# Patient Record
Sex: Male | Born: 2000 | Race: White | Hispanic: No | Marital: Single | Smoking: Never smoker
Health system: Southern US, Community
[De-identification: ages and names within clinical notes are randomized; demographics above are authoritative.]

---
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
## 2023-12-27 ENCOUNTER — Other Ambulatory Visit: Payer: Self-pay

## 2023-12-27 ENCOUNTER — Emergency Department (HOSPITAL_COMMUNITY)
Admission: EM | Admit: 2023-12-27 | Discharge: 2023-12-27 | Disposition: A | Discharge status: Home / Self Care | Payer: Worker's Compensation | Attending: Emergency Medicine | Admitting: Emergency Medicine

## 2023-12-27 ENCOUNTER — Encounter (HOSPITAL_COMMUNITY): Payer: Self-pay | Admitting: Emergency Medicine

## 2023-12-27 DIAGNOSIS — S60941A Unspecified superficial injury of left index finger, initial encounter: Secondary | ICD-10-CM | POA: Diagnosis present

## 2023-12-27 DIAGNOSIS — X58XXXA Exposure to other specified factors, initial encounter: Secondary | ICD-10-CM | POA: Insufficient documentation

## 2023-12-27 DIAGNOSIS — S61301A Unspecified open wound of left index finger with damage to nail, initial encounter: Secondary | ICD-10-CM | POA: Insufficient documentation

## 2023-12-27 DIAGNOSIS — T148XXA Other injury of unspecified body region, initial encounter: Secondary | ICD-10-CM

## 2023-12-27 MED ORDER — SILVER NITRATE-POT NITRATE 75-25 % EX MISC
1.0000 | Freq: Once | CUTANEOUS | Status: AC
  Administered 2023-12-27: 1 via TOPICAL
  Filled 2023-12-27: qty 10

## 2023-12-27 MED ORDER — LIDOCAINE-EPINEPHRINE-TETRACAINE (LET) TOPICAL GEL
3.0000 mL | Freq: Once | TOPICAL | Status: AC
  Administered 2023-12-27: 3 mL via TOPICAL
  Filled 2023-12-27: qty 3

## 2023-12-27 NOTE — ED Provider Triage Note (Signed)
 Emergency Medicine Provider Triage Evaluation Note  Devin Porter , a 23 y.o. male  was evaluated in triage.  Pt complains of finger avulsion.  Patient was seen earlier today for evaluation of tissue avulsion which occurred at work earlier.  He is up-to-date on tetanus vaccination.  They were able to get his bleeding to stop earlier at the urgent care however when he got home it bled through his bandage and he return for further evaluation.  He is right-hand dominant.  This is his left index finger.  Review of Systems  Positive: Tissue avulsion and active bleeding Negative: Pulsatile  Physical Exam  BP (!) 146/82 (BP Location: Right Arm)   Pulse 76   Temp 98.4 F (36.9 C) (Oral)   Resp 17   SpO2 99%  Gen:   Awake, no distress   Resp:  Normal effort  MSK:   Moves extremities without difficulty  Other:    Medical Decision Making  Medically screening exam initiated at 5:10 PM.  Appropriate orders placed.  Arthea Levander Minder was informed that the remainder of the evaluation will be completed by another provider, this initial triage assessment does not replace that evaluation, and the importance of remaining in the ED until their evaluation is complete.  Let applied 1713   Arloa Chroman, PA-C 12/27/23 1713

## 2023-12-27 NOTE — Discharge Instructions (Signed)
 WOUND CARE   Keep area clean and dry for 48 hours. Do not remove bandage, if applied.  After 48hours, remove bandage and wash wound gently with mild soap and warm water. Reapply a new bandage after cleaning wound, if directed.  Continue daily cleansing with soap and water until stitches/staples are removed.  Seek medical careif you experience any of the following signs of infection: Swelling, redness, pus drainage, streaking, fever >101.0 F  Seek care if you experience excessive bleeding that does not stop after 15-20 minutes of constant, firm pressure.

## 2023-12-27 NOTE — ED Provider Notes (Signed)
 [place] EMERGENCY DEPARTMENT AT [facility] Provider Note   CSN: [id number] Arrival date & time: 12/27/23  1556     History  Chief Complaint  Patient presents with   Laceration    Devin Porter is a 23 y.o. male Pt complains of finger avulsion.  Patient was seen earlier today for evaluation of tissue avulsion which occurred at work earlier.  He is up-to-date on tetanus vaccination.  They were able to get his bleeding to stop earlier at the urgent care however when he got home it bled through his bandage and he return for further evaluation.  He is right-hand dominant.  This is his left index finger.    Laceration      Home Medications Prior to Admission medications   Not on File      Allergies    Patient has no known allergies.    Review of Systems   Review of Systems  Physical Exam Updated Vital Signs BP (!) 146/82 (BP Location: Right Arm)   Pulse 76   Temp 98.4 F (36.9 C) (Oral)   Resp 17   SpO2 99%  Physical Exam Vitals and nursing note reviewed.  Constitutional:      General: He is not in acute distress.    Appearance: He is well-developed. He is not diaphoretic.  HENT:     Head: Normocephalic and atraumatic.  Eyes:     General: No scleral icterus.    Conjunctiva/sclera: Conjunctivae normal.  Cardiovascular:     Rate and Rhythm: Normal rate and regular rhythm.     Heart sounds: Normal heart sounds.  Pulmonary:     Effort: Pulmonary effort is normal. No respiratory distress.     Breath sounds: Normal breath sounds.  Abdominal:     Palpations: Abdomen is soft.     Tenderness: There is no abdominal tenderness.  Musculoskeletal:     Cervical back: Normal range of motion and neck supple.     Comments: Left index finger with tissue avulsion  Skin:    General: Skin is warm and dry.  Neurological:     Mental Status: He is alert.  Psychiatric:        Behavior: Behavior normal.     ED Results / Procedures / Treatments   Labs (all  labs ordered are listed, but only abnormal results are displayed) Labs Reviewed - No data to display  EKG None  Radiology No results found.  Procedures Procedures    Medications Ordered in ED Medications  lidocaine -EPINEPHrine -tetracaine  (LET) topical gel (has no administration in time range)    ED Course/ Medical Decision Making/ A&P Clinical Course as of 12/27/23 1812  Mon Dec 27, 2023  1713 Let applied [AH]    Clinical Course User Index [AH] Arloa Chroman, PA-C                                 Medical Decision Making Risk Prescription drug management.   23 year old male who presents emergency department with bleeding finger with tissue avulsion.  Let applied.  I then cauterized the wound using silver  nitrate with significant improvement.  Mild amount of oozing present.  Patient given strict return precautions and home care instructions for management of bleeding.  Appropriate for discharge at this time.        Final Clinical Impression(s) / ED Diagnoses Final diagnoses:  Soft tissue avulsion    Rx / DC Orders  ED Discharge Orders     None         Arloa Chroman, PA-C 12/27/23 2347    Francesca Elsie CROME, MD 01/01/24 [phone]

## 2023-12-27 NOTE — ED Triage Notes (Signed)
 Pt cut left index finger with tool at work pta. Was seen at [facility] and sent home and finger started bleeding again so came to ED. Was cut at 1115am today. Pt did received TET shot at [facility] today.
